# Patient Record
Sex: Male | Born: 2005 | Hispanic: Yes | Marital: Single
Health system: Southern US, Community
[De-identification: ages and names within clinical notes are randomized; demographics above are authoritative.]

## PROBLEM LIST (undated history)

## (undated) DIAGNOSIS — J353 Hypertrophy of tonsils with hypertrophy of adenoids: Secondary | ICD-10-CM

---
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
## 2024-04-14 ENCOUNTER — Other Ambulatory Visit: Payer: Self-pay

## 2024-04-14 ENCOUNTER — Emergency Department (HOSPITAL_BASED_OUTPATIENT_CLINIC_OR_DEPARTMENT_OTHER)
Admission: EM | Admit: 2024-04-14 | Discharge: 2024-04-14 | Disposition: A | Discharge status: Home / Self Care | Payer: MEDICAID | Attending: Emergency Medicine | Admitting: Emergency Medicine

## 2024-04-14 ENCOUNTER — Encounter (HOSPITAL_BASED_OUTPATIENT_CLINIC_OR_DEPARTMENT_OTHER): Payer: Self-pay | Admitting: Emergency Medicine

## 2024-04-14 DIAGNOSIS — R21 Rash and other nonspecific skin eruption: Secondary | ICD-10-CM | POA: Diagnosis present

## 2024-04-14 DIAGNOSIS — L299 Pruritus, unspecified: Secondary | ICD-10-CM | POA: Insufficient documentation

## 2024-04-14 MED ORDER — HYDROXYZINE HCL 25 MG PO TABS: 25.0000 mg | ORAL_TABLET | Freq: Four times a day (QID) | ORAL | 0 refills | Status: AC

## 2024-04-14 NOTE — ED Triage Notes (Signed)
 Pt c/o itchy rash to RT flank, bil ankles and wrists

## 2024-04-14 NOTE — ED Provider Notes (Signed)
 [facility] EMERGENCY DEPARTMENT AT [facility] Provider Note   CSN: [id number] Arrival date & time: 04/14/24  1521     History Chief Complaint  Patient presents with   Rash    Justin Blackwell is a 18 y.o. male.  Patient presents the emergency department today with concerns of rash.  Reports that he is noted a rash present to bilateral ankles, wrist, and the right flank.  Also noticing some rash to the bilateral forearms.  He has some concerns for possible scabies given prior history of scabies although denies any contact with any individual with scabies.  He does report that he has been in the forest recently with hiking and fishing and does not believe that he has sustained any tick bites as far as he has noticed.   Rash      Home Medications Prior to Admission medications   Medication Sig Start Date End Date Taking? Authorizing Provider  hydrOXYzine (ATARAX) 25 MG tablet Take 1 tablet (25 mg total) by mouth every 6 (six) hours. 04/14/24  Yes Evaristo Tsuda A, PA-C  amoxicillin -clavulanate (AUGMENTIN ) 875-125 MG tablet Take 1 tablet by mouth every 12 (twelve) hours. 08/15/20   Butler, Michael C, MD  beclomethasone (QVAR) 40 MCG/ACT inhaler Inhale 2 puffs into the lungs 2 (two) times daily.    [provider]  BENZACLIN  gel Apply to acne twice a day when needed 02/03/17   Stanley, Angela J, MD  HYDROcodone -acetaminophen  (NORCO/VICODIN) 5-325 MG tablet Take 1-2 tablets by mouth every 4 (four) hours as needed. 08/15/20   Tonya Fredrickson, MD  lactobacillus acidophilus & bulgar (LACTINEX) chewable tablet Chew 1 tablet by mouth 3 (three) times daily with meals. Patient not taking: Reported on 02/03/2017 10/08/14   Vedia Geralds, NP  ondansetron  (ZOFRAN  ODT) 4 MG disintegrating tablet Take 1 tablet (4 mg total) by mouth every 8 (eight) hours as needed. Patient not taking: Reported on 02/03/2017 10/08/14   Vedia Geralds, NP  permethrin  (ELIMITE ) 5 % cream Apply  to affected area once 06/07/23   Henderly, Britni A, PA-C      Allergies    Patient has no known allergies.    Review of Systems   Review of Systems  Skin:  Positive for rash.  All other systems reviewed and are negative.   Physical Exam Updated Vital Signs BP 128/78 (BP Location: Right Arm)   Pulse 92   Temp 98.4 F (36.9 C) (Oral)   Resp 18   Ht 5\' 11"  (1.803 m)   Wt 102.1 kg   SpO2 99%   BMI 31.38 kg/m  Physical Exam Vitals and nursing note reviewed.  Constitutional:      General: He is not in acute distress.    Appearance: He is well-developed.  HENT:     Head: Normocephalic and atraumatic.  Eyes:     Conjunctiva/sclera: Conjunctivae normal.  Cardiovascular:     Rate and Rhythm: Normal rate and regular rhythm.     Heart sounds: No murmur heard. Pulmonary:     Effort: Pulmonary effort is normal. No respiratory distress.     Breath sounds: Normal breath sounds.  Abdominal:     Palpations: Abdomen is soft.     Tenderness: There is no abdominal tenderness.  Musculoskeletal:        General: No swelling.     Cervical back: Neck supple.  Skin:    General: Skin is warm and dry.     Capillary Refill: Capillary refill takes less  than 2 seconds.     Comments: No clear distal rash on patient's body.  There are some areas with some scabbing present due to patient scratching but no obvious macules, papules, or vesicles.  There is no obvious skin discoloration.  No appreciable sinus tracts or any signs of any foreign body in the webspaces of bilateral hands or feet.  Neurological:     Mental Status: He is alert.  Psychiatric:        Mood and Affect: Mood normal.     ED Results / Procedures / Treatments   Labs (all labs ordered are listed, but only abnormal results are displayed) Labs Reviewed - No data to display  EKG None  Radiology No results found.  Procedures Procedures    Medications Ordered in ED Medications - No data to display  ED Course/ Medical  Decision Making/ A&P                                 Medical Decision Making Risk Prescription drug management.   This patient presents to the ED for concern of rash.  Differential diagnosis includes contact dermatitis, tickborne illness, scabies, dermatitis, cellulitis   Problem List / ED Course:  Patient presents emergency department concerns of rash.  Reports has been ongoing for the last several days present primarily to the right lower back, bilateral arms and bilateral legs/feet.  He reports being in the forest recently with hiking and fishing denies any obvious insect exposure or contact with poison ivy or poison oak.  States that the itching is somewhat worse in the evenings.  Has not tried any specific medications to help with the symptoms. On exam, there is no significant rashes seen and no obvious distribution of rash that coincides with any specific etiology.  Low concern for scabies as there is none involvement of the webspaces of hands or feet and no obvious burrowing in the wrist.  The areas that do see some irritation patient noted of skin excoriation due to itching with some scabbing present.  Unclear cause of patient's current symptoms without obvious rash to suggest poison ivy or poison oak dermatitis.  Discussed treatment with topical medications such as calamine lotion, topical Benadryl, and use of hydroxyzine for itching.  Patient is agreeable with this plan.  Do not feel that patient would benefit from use of permethrin  as findings are not indicative of scabies at this time.  Return precautions discussed such as concerns for new or worsening symptoms.  Patient stable at this time for outpatient follow-up and discharged home.  Final Clinical Impression(s) / ED Diagnoses Final diagnoses:  Pruritus    Rx / DC Orders ED Discharge Orders          Ordered    hydrOXYzine (ATARAX) 25 MG tablet  Every 6 hours        04/14/24 1623              Cori Henningsen A,  PA-C 04/14/24 1732    Mordecai Applebaum, MD 04/14/24 2142

## 2024-04-14 NOTE — Discharge Instructions (Signed)
 You were seen in the ER today for concerns of a rash and itching. Based on your exam, the rash you have is very nonspecific and does not appear to be consistent with any clear infection or exposure. I would recommend starting a medication to help with the itching as well as using topical moisturizers to help reduce skin dryness that may be making the itching worse. Please use these as prescribed. For any concerns for new or worsening symptoms, return to the ER. Otherwise, please follow up with your primary care provider.
